# Patient Record
Sex: Male | Born: 1980 | Race: White | Hispanic: No | Marital: Single | Smoking: Current every day smoker
Health system: Southern US, Community
[De-identification: ages and names within clinical notes are randomized; demographics above are authoritative.]

## PROBLEM LIST (undated history)

## (undated) DIAGNOSIS — F419 Anxiety disorder, unspecified: Secondary | ICD-10-CM

---

## 2015-07-22 ENCOUNTER — Emergency Department (HOSPITAL_COMMUNITY): Payer: BLUE CROSS/BLUE SHIELD

## 2015-07-22 ENCOUNTER — Encounter (HOSPITAL_COMMUNITY): Payer: Self-pay

## 2015-07-22 ENCOUNTER — Emergency Department (HOSPITAL_COMMUNITY)
Admission: EM | Admit: 2015-07-22 | Discharge: 2015-07-22 | Disposition: A | Discharge status: Home / Self Care | Payer: BLUE CROSS/BLUE SHIELD | Attending: Emergency Medicine | Admitting: Emergency Medicine

## 2015-07-22 DIAGNOSIS — F1310 Sedative, hypnotic or anxiolytic abuse, uncomplicated: Secondary | ICD-10-CM | POA: Diagnosis not present

## 2015-07-22 DIAGNOSIS — F419 Anxiety disorder, unspecified: Secondary | ICD-10-CM | POA: Insufficient documentation

## 2015-07-22 DIAGNOSIS — Z79899 Other long term (current) drug therapy: Secondary | ICD-10-CM | POA: Insufficient documentation

## 2015-07-22 DIAGNOSIS — F17200 Nicotine dependence, unspecified, uncomplicated: Secondary | ICD-10-CM | POA: Insufficient documentation

## 2015-07-22 DIAGNOSIS — R0602 Shortness of breath: Secondary | ICD-10-CM | POA: Diagnosis not present

## 2015-07-22 DIAGNOSIS — R0789 Other chest pain: Secondary | ICD-10-CM | POA: Insufficient documentation

## 2015-07-22 DIAGNOSIS — R079 Chest pain, unspecified: Secondary | ICD-10-CM | POA: Diagnosis present

## 2015-07-22 DIAGNOSIS — Z7982 Long term (current) use of aspirin: Secondary | ICD-10-CM | POA: Insufficient documentation

## 2015-07-22 HISTORY — DX: Anxiety disorder, unspecified: F41.9

## 2015-07-22 LAB — DIFFERENTIAL
BASOS ABS: 0 10*3/uL (ref 0.0–0.1)
Basophils Relative: 0 %
Eosinophils Absolute: 0 10*3/uL (ref 0.0–0.7)
Eosinophils Relative: 0 %
LYMPHS ABS: 1 10*3/uL (ref 0.7–4.0)
Lymphocytes Relative: 6 %
MONOS PCT: 7 %
Monocytes Absolute: 1.3 10*3/uL — ABNORMAL HIGH (ref 0.1–1.0)
NEUTROS ABS: 15.7 10*3/uL — AB (ref 1.7–7.7)
Neutrophils Relative %: 87 %

## 2015-07-22 LAB — BASIC METABOLIC PANEL
Anion gap: 12 (ref 5–15)
BUN: 13 mg/dL (ref 6–20)
CHLORIDE: 104 mmol/L (ref 101–111)
CO2: 25 mmol/L (ref 22–32)
CREATININE: 1 mg/dL (ref 0.61–1.24)
Calcium: 9.1 mg/dL (ref 8.9–10.3)
Glucose, Bld: 110 mg/dL — ABNORMAL HIGH (ref 65–99)
Potassium: 4.6 mmol/L (ref 3.5–5.1)
SODIUM: 141 mmol/L (ref 135–145)

## 2015-07-22 LAB — URINALYSIS, ROUTINE W REFLEX MICROSCOPIC
Bilirubin Urine: NEGATIVE
GLUCOSE, UA: NEGATIVE mg/dL
HGB URINE DIPSTICK: NEGATIVE
Ketones, ur: 15 mg/dL — AB
Leukocytes, UA: NEGATIVE
Nitrite: NEGATIVE
Protein, ur: NEGATIVE mg/dL
Specific Gravity, Urine: 1.042 — ABNORMAL HIGH (ref 1.005–1.030)
pH: 5 (ref 5.0–8.0)

## 2015-07-22 LAB — RAPID URINE DRUG SCREEN, HOSP PERFORMED
Amphetamines: NOT DETECTED
BARBITURATES: NOT DETECTED
Benzodiazepines: POSITIVE — AB
Cocaine: NOT DETECTED
OPIATES: NOT DETECTED
TETRAHYDROCANNABINOL: NOT DETECTED

## 2015-07-22 LAB — CBC
HCT: 44.8 % (ref 39.0–52.0)
Hemoglobin: 14.9 g/dL (ref 13.0–17.0)
MCH: 31.3 pg (ref 26.0–34.0)
MCHC: 33.3 g/dL (ref 30.0–36.0)
MCV: 94.1 fL (ref 78.0–100.0)
PLATELETS: 233 10*3/uL (ref 150–400)
RBC: 4.76 MIL/uL (ref 4.22–5.81)
RDW: 12.5 % (ref 11.5–15.5)
WBC: 17.8 10*3/uL — AB (ref 4.0–10.5)

## 2015-07-22 LAB — HEPATIC FUNCTION PANEL
ALBUMIN: 3.7 g/dL (ref 3.5–5.0)
ALK PHOS: 44 U/L (ref 38–126)
ALT: 23 U/L (ref 17–63)
AST: 31 U/L (ref 15–41)
BILIRUBIN TOTAL: 0.3 mg/dL (ref 0.3–1.2)
Bilirubin, Direct: <0.1 mg/dL — ABNORMAL LOW (ref 0.1–0.5)
Total Protein: 6.4 g/dL — ABNORMAL LOW (ref 6.5–8.1)

## 2015-07-22 LAB — I-STAT TROPONIN, ED
TROPONIN I, POC: 0 ng/mL (ref 0.00–0.08)
Troponin i, poc: 0.02 ng/mL (ref 0.00–0.08)

## 2015-07-22 LAB — ETHANOL: ALCOHOL ETHYL (B): 79 mg/dL — AB (ref <5)

## 2015-07-22 LAB — LIPASE, BLOOD: LIPASE: 25 U/L (ref 11–51)

## 2015-07-22 MED ORDER — IOHEXOL 350 MG/ML SOLN
90.0000 mL | Freq: Once | INTRAVENOUS | Status: AC | PRN
  Administered 2015-07-22: 90 mL via INTRAVENOUS

## 2015-07-22 MED ORDER — SODIUM CHLORIDE 0.9 % IV SOLN
INTRAVENOUS | Status: DC
  Administered 2015-07-22: 11:00:00 via INTRAVENOUS

## 2015-07-22 MED ORDER — OXYCODONE-ACETAMINOPHEN 5-325 MG PO TABS
1.0000 | ORAL_TABLET | Freq: Once | ORAL | Status: AC
  Administered 2015-07-22: 1 via ORAL
  Filled 2015-07-22: qty 1

## 2015-07-22 MED ORDER — SODIUM CHLORIDE 0.9 % IV SOLN
1000.0000 mL | Freq: Once | INTRAVENOUS | Status: AC
  Administered 2015-07-22: 1000 mL via INTRAVENOUS

## 2015-07-22 MED ORDER — FAMOTIDINE IN NACL 20-0.9 MG/50ML-% IV SOLN
20.0000 mg | Freq: Once | INTRAVENOUS | Status: AC
Start: 2015-07-22 — End: 2015-07-22
  Administered 2015-07-22: 20 mg via INTRAVENOUS
  Filled 2015-07-22: qty 50

## 2015-07-22 MED ORDER — LORAZEPAM 2 MG/ML IJ SOLN
2.0000 mg | Freq: Once | INTRAMUSCULAR | Status: AC
  Administered 2015-07-22: 2 mg via INTRAVENOUS
  Filled 2015-07-22: qty 1

## 2015-07-22 NOTE — Progress Notes (Signed)
Discussed patient with PA Mayme Genta. Presents with atypical chest pain, worst with respiration. CT PE negative. I have recommended repeat EKG and troponin, if negative ok for discharge and we will arrange new patient appt. Of note does have some LAD disease incidentally found on CT, however there is not supportive evidence of ischemia a this time. Defer leukocytosis evaluation to ER.   Dominga Ferry MD

## 2015-07-22 NOTE — ED Notes (Signed)
Pt at xray

## 2015-07-22 NOTE — ED Provider Notes (Signed)
CSN: [id number]     Arrival date & time 07/22/15  4098 History   First MD Initiated Contact with Patient 07/22/15 [phone]     Chief Complaint  Patient presents with  . Chest Pain  . Shortness of Breath     (Consider location/radiation/quality/duration/timing/severity/associated sxs/prior Treatment) HPI Johnny Graham is a 35 y.o. male with history of anxiety comes in for evaluation of chest pain and shortness of breath. Patient works at approximately 12:00 AM this morning, while lying in the bed he experienced sudden onset diffuse chest pain that radiated through to his back. He cannot characterize the discomfort other than to say "it is uncomfortable". He tried taking Xanax as he thought it was a panic attack which seemed to help, but symptoms returned several hours later. He also took a Nexium at that time which did not seem to help. He reports having tacos for dinner. He reports inability to take a deep breath as this exacerbates his discomfort. He reports lying flat worsens his discomfort, while sitting up seems to help his discomfort. Denies any associated nausea, vomiting, diaphoresis, numbness or weakness. No history of hypertension, hyperlipidemia, diabetes or family heart history. He does admit to smoking 5-6 cigarettes per day. He reports that he is a frequent exerciser and does not feel the sensation with intense exercise. No recent travel, surgeries or immobilizations, hemoptysis, unilateral leg swelling, exogenous estrogen use or personal history of cancer.  Past Medical History  Diagnosis Date  . Anxiety    History reviewed. No pertinent past surgical history. History reviewed. No pertinent family history. Social History  Substance Use Topics  . Smoking status: Current Every Day Smoker  . Smokeless tobacco: None  . Alcohol Use: Yes    Review of Systems A 10 point review of systems was completed and was negative except for pertinent positives and negatives as mentioned in the  history of present illness     Allergies  Review of patient's allergies indicates no known allergies.  Home Medications   Prior to Admission medications   Medication Sig Start Date End Date Taking? Authorizing Provider  ALPRAZolam Prudy Feeler) 1 MG tablet Take 1 mg by mouth once.   Yes Historical Provider, MD  aspirin 325 MG tablet Take 325 mg by mouth once.   Yes Historical Provider, MD  busPIRone (BUSPAR) 5 MG tablet Take 5 mg by mouth 4 (four) times daily.   Yes Historical Provider, MD  esomeprazole (NEXIUM) 20 MG capsule Take 20 mg by mouth once.   Yes Historical Provider, MD   BP 123/69 mmHg  Pulse 96  Temp(Src) 98.4 F (36.9 C) (Oral)  Resp 25  SpO2 95% Physical Exam  Constitutional: He is oriented to person, place, and time. He appears well-developed and well-nourished.  HENT:  Head: Normocephalic and atraumatic.  Mouth/Throat: Oropharynx is clear and moist.  Eyes: Conjunctivae are normal. Pupils are equal, round, and reactive to light. Right eye exhibits no discharge. Left eye exhibits no discharge. No scleral icterus.  Neck: Neck supple.  Cardiovascular: Normal rate, regular rhythm and normal heart sounds.   Pulmonary/Chest: Effort normal. No respiratory distress.  Difficult to appreciate lung sounds, patient cannot fully inspire due to pain.  Abdominal: Soft. There is no tenderness.  Musculoskeletal: Normal range of motion. He exhibits no edema or tenderness.  Neurological: He is alert and oriented to person, place, and time.  Cranial Nerves II-XII grossly intact  Skin: Skin is warm and dry. No rash noted.  Psychiatric: He has a  normal mood and affect.  Nursing note and vitals reviewed.   ED Course  Procedures (including critical care time) Labs Review Labs Reviewed  BASIC METABOLIC PANEL - Abnormal; Notable for the following:    Glucose, Bld 110 (*)    All other components within normal limits  CBC - Abnormal; Notable for the following:    WBC 17.8 (*)    All  other components within normal limits  DIFFERENTIAL - Abnormal; Notable for the following:    Neutro Abs 15.7 (*)    Monocytes Absolute 1.3 (*)    All other components within normal limits  HEPATIC FUNCTION PANEL - Abnormal; Notable for the following:    Total Protein 6.4 (*)    Bilirubin, Direct <0.1 (*)    All other components within normal limits  ETHANOL - Abnormal; Notable for the following:    Alcohol, Ethyl (B) 79 (*)    All other components within normal limits  URINALYSIS, ROUTINE W REFLEX MICROSCOPIC (NOT AT [facility]) - Abnormal; Notable for the following:    Specific Gravity, Urine 1.042 (*)    Ketones, ur 15 (*)    All other components within normal limits  URINE RAPID DRUG SCREEN, HOSP PERFORMED - Abnormal; Notable for the following:    Benzodiazepines POSITIVE (*)    All other components within normal limits  LIPASE, BLOOD  CBC WITH DIFFERENTIAL/PLATELET  I-STAT TROPOININ, ED  I-STAT TROPOININ, ED    Imaging Review Dg Chest 2 View  07/22/2015  CLINICAL DATA:  Pt reports chest pain on both sides of his chest radiating to his back with SOB since 1200 AM today; light smoker EXAM: CHEST  2 VIEW COMPARISON:  None. FINDINGS: The heart size and mediastinal contours are within normal limits. Both lungs are clear. No pleural effusion or pneumothorax. The visualized skeletal structures are unremarkable. IMPRESSION: No active cardiopulmonary disease. Electronically Signed   By: Amie Portland M.D.   On: 07/22/2015 08:04   Ct Angio Chest Pe W/cm &/or Wo Cm  07/22/2015  CLINICAL DATA:  Chest pain and shortness of breath. EXAM: CT ANGIOGRAPHY CHEST WITH CONTRAST TECHNIQUE: Multidetector CT imaging of the chest was performed using the standard protocol during bolus administration of intravenous contrast. Multiplanar CT image reconstructions and MIPs were obtained to evaluate the vascular anatomy. CONTRAST:  90mL OMNIPAQUE IOHEXOL 350 MG/ML SOLN COMPARISON:  Chest radiograph from earlier  today. FINDINGS: Mediastinum/Nodes: The study is high quality for the evaluation of pulmonary embolism. There are no filling defects in the central, lobar, segmental or subsegmental pulmonary artery branches to suggest acute pulmonary embolism. Great vessels are normal in course and caliber. Normal heart size. No significant pericardial fluid/thickening. Left anterior descending coronary atherosclerosis. Normal visualized thyroid. Normal esophagus. No pathologically enlarged axillary, mediastinal or hilar lymph nodes. Lungs/Pleura: No pneumothorax. No pleural effusion. No acute consolidative airspace disease, significant pulmonary nodules or lung masses. There is subsegmental atelectasis in the dependent lower lobes. Upper abdomen: Unremarkable. Musculoskeletal:  No aggressive appearing focal osseous lesions. Review of the MIP images confirms the above findings. IMPRESSION: 1. No acute pulmonary embolism. 2. LAD coronary atherosclerosis. 3. Mild bibasilar atelectasis. Electronically Signed   By: Delbert Phenix M.D.   On: 07/22/2015 10:52   I have personally reviewed and evaluated these images and lab results as part of my medical decision-making.   EKG Interpretation   Date/Time:  Sunday July 22 2015 14:15:11 EST Ventricular Rate:  94 PR Interval:  158 QRS Duration: 95 QT Interval:  345  QTC Calculation: 431 R Axis:   43 Text Interpretation:  Sinus rhythm ST elevation suggests acute  pericarditis Baseline wander in lead(s) V3 agree. Confirmed by Donnald Garre,  MD, Lebron Conners [id number]) on 07/22/2015 2:29:58 PM     Meds given in ED:  Medications  0.9 %  sodium chloride infusion ( Intravenous Stopped 07/22/15 1451)  oxyCODONE-acetaminophen (PERCOCET/ROXICET) 5-325 MG per tablet 1 tablet (1 tablet Oral Given 07/22/15 0815)  famotidine (PEPCID) IVPB 20 mg premix (0 mg Intravenous Stopped 07/22/15 1017)  LORazepam (ATIVAN) injection 2 mg (2 mg Intravenous Given 07/22/15 0947)  0.9 %  sodium chloride infusion (0  mLs Intravenous Stopped 07/22/15 1220)  iohexol (OMNIPAQUE) 350 MG/ML injection 90 mL (90 mLs Intravenous Contrast Given 07/22/15 1022)    New Prescriptions   No medications on file   Filed Vitals:   07/22/15 1230 07/22/15 1315 07/22/15 1400 07/22/15 1445  BP: 115/69 107/67 122/86 123/69  Pulse: 89 91 101 96  Temp:      TempSrc:      Resp: 33 SpO2: 95% 98% 97% 95%    MDM  Johnny Graham is a 34 y.o. male who presents for evaluation of chest pain and shortness of breath that woke him from sleep at approximately midnight this morning. Risk factors include smoking 5 or 6 cigarettes per day, otherwise no risk factors. Difficult to appreciate lung sounds on auscultation, patient does not take full breaths due to discomfort. Physical exam is otherwise unremarkable. Plan to obtain EKG, troponin, chest x-ray and screening labs.  Symptoms improved with Percocet. Patient has leukocytosis 17.8 with left shift, chest x-ray negative. Will obtain CT to evaluate for occult pneumonia. CT shows LAD vessel disease. No pulmonary embolus or other acute cardio pulmonary pathology. Initial troponin negative. EKG was nonischemic changes. Consult cardiology, Dr. Wyline Mood recommends repeat EKG, delta troponin and if no changes will be able to follow-up outpatient. EKG somewhat changed, but still no evidence of ischemia. Delta troponin negative. Patient will be contacted to follow-up outpatient with cardiology. Prior to patient discharge, I discussed and reviewed this case with my attending, Dr. Donnald Garre, who also saw and evaluated the patient and agrees with above plan.  Final diagnoses:  Atypical chest pain        Joycie Peek, PA-C 07/22/15 1513  Arby Barrette, MD 07/23/15 [phone]

## 2015-07-22 NOTE — Discharge Instructions (Signed)
You were evaluated in the ED today for your chest pain. There does not appear to be an emergent cause her symptoms at this time. However, cardiology will contact you to schedule an outpatient appointment. He may continue taking NSAID such as ibuprofen for your discomfort in the meantime. Return to ED for any new or worsening symptoms.  Nonspecific Chest Pain  Chest pain can be caused by many different conditions. There is always a chance that your pain could be related to something serious, such as a heart attack or a blood clot in your lungs. Chest pain can also be caused by conditions that are not life-threatening. If you have chest pain, it is very important to follow up with your health care provider. CAUSES  Chest pain can be caused by:  Heartburn.  Pneumonia or bronchitis.  Anxiety or stress.  Inflammation around your heart (pericarditis) or lung (pleuritis or pleurisy).  A blood clot in your lung.  A collapsed lung (pneumothorax). It can develop suddenly on its own (spontaneous pneumothorax) or from trauma to the chest.  Shingles infection (varicella-zoster virus).  Heart attack.  Damage to the bones, muscles, and cartilage that make up your chest wall. This can include:  Bruised bones due to injury.  Strained muscles or cartilage due to frequent or repeated coughing or overwork.  Fracture to one or more ribs.  Sore cartilage due to inflammation (costochondritis). RISK FACTORS  Risk factors for chest pain may include:  Activities that increase your risk for trauma or injury to your chest.  Respiratory infections or conditions that cause frequent coughing.  Medical conditions or overeating that can cause heartburn.  Heart disease or family history of heart disease.  Conditions or health behaviors that increase your risk of developing a blood clot.  Having had chicken pox (varicella zoster). SIGNS AND SYMPTOMS Chest pain can feel like:  Burning or tingling on the  surface of your chest or deep in your chest.  Crushing, pressure, aching, or squeezing pain.  Dull or sharp pain that is worse when you move, cough, or take a deep breath.  Pain that is also felt in your back, neck, shoulder, or arm, or pain that spreads to any of these areas. Your chest pain may come and go, or it may stay constant. DIAGNOSIS Lab tests or other studies may be needed to find the cause of your pain. Your health care provider may have you take a test called an ambulatory ECG (electrocardiogram). An ECG records your heartbeat patterns at the time the test is performed. You may also have other tests, such as:  Transthoracic echocardiogram (TTE). During echocardiography, sound waves are used to create a picture of all of the heart structures and to look at how blood flows through your heart.  Transesophageal echocardiogram (TEE).This is a more advanced imaging test that obtains images from inside your body. It allows your health care provider to see your heart in finer detail.  Cardiac monitoring. This allows your health care provider to monitor your heart rate and rhythm in real time.  Holter monitor. This is a portable device that records your heartbeat and can help to diagnose abnormal heartbeats. It allows your health care provider to track your heart activity for several days, if needed.  Stress tests. These can be done through exercise or by taking medicine that makes your heart beat more quickly.  Blood tests.  Imaging tests. TREATMENT  Your treatment depends on what is causing your chest pain. Treatment may  include:  Medicines. These may include:  Acid blockers for heartburn.  Anti-inflammatory medicine.  Pain medicine for inflammatory conditions.  Antibiotic medicine, if an infection is present.  Medicines to dissolve blood clots.  Medicines to treat coronary artery disease.  Supportive care for conditions that do not require medicines. This may  include:  Resting.  Applying heat or cold packs to injured areas.  Limiting activities until pain decreases. HOME CARE INSTRUCTIONS  If you were prescribed an antibiotic medicine, finish it all even if you start to feel better.  Avoid any activities that bring on chest pain.  Do not use any tobacco products, including cigarettes, chewing tobacco, or electronic cigarettes. If you need help quitting, ask your health care provider.  Do not drink alcohol.  Take medicines only as directed by your health care provider.  Keep all follow-up visits as directed by your health care provider. This is important. This includes any further testing if your chest pain does not go away.  If heartburn is the cause for your chest pain, you may be told to keep your head raised (elevated) while sleeping. This reduces the chance that acid will go from your stomach into your esophagus.  Make lifestyle changes as directed by your health care provider. These may include:  Getting regular exercise. Ask your health care provider to suggest some activities that are safe for you.  Eating a heart-healthy diet. A registered dietitian can help you to learn healthy eating options.  Maintaining a healthy weight.  Managing diabetes, if necessary.  Reducing stress. SEEK MEDICAL CARE IF:  Your chest pain does not go away after treatment.  You have a rash with blisters on your chest.  You have a fever. SEEK IMMEDIATE MEDICAL CARE IF:   Your chest pain is worse.  You have an increasing cough, or you cough up blood.  You have severe abdominal pain.  You have severe weakness.  You faint.  You have chills.  You have sudden, unexplained chest discomfort.  You have sudden, unexplained discomfort in your arms, back, neck, or jaw.  You have shortness of breath at any time.  You suddenly start to sweat, or your skin gets clammy.  You feel nauseous or you vomit.  You suddenly feel light-headed or  dizzy.  Your heart begins to beat quickly, or it feels like it is skipping beats. These symptoms may represent a serious problem that is an emergency. Do not wait to see if the symptoms will go away. Get medical help right away. Call your local emergency services (911 in the [place] Do not drive yourself to the hospital.   This information is not intended to replace advice given to you by your health care provider. Make sure you discuss any questions you have with your health care provider.   Document Released: 02/26/2005 Document Revised: 06/09/2014 Document Reviewed: 12/23/2013 Elsevier Interactive Patient Education [employer].

## 2015-07-22 NOTE — ED Notes (Signed)
Pt here for chest pain and sob that woke him from sleep. Denies n/v, diaphoresis  Or weakness.

## 2017-07-20 IMAGING — CT CT ANGIO CHEST
2 of 9 series · 14 of 36 positions shown · IV contrast (Iohexol (Omnipaque 350))
Comparison: Chest radiograph from earlier today.

CLINICAL DATA: Chest pain and shortness of breath.

EXAM:
CT ANGIOGRAPHY CHEST WITH CONTRAST
TECHNIQUE: Multidetector CT imaging of the chest was performed using the
standard protocol during bolus administration of intravenous
contrast. Multiplanar CT image reconstructions and MIPs were
obtained to evaluate the vascular anatomy.
CONTRAST:  90mL OMNIPAQUE IOHEXOL 350 MG/ML SOLN

[Series 402: thins pacs · axial · 0.63mm/px · z∈[-282,-58]mm · 13 of 262 slices shown]
[im 19/262  lung]
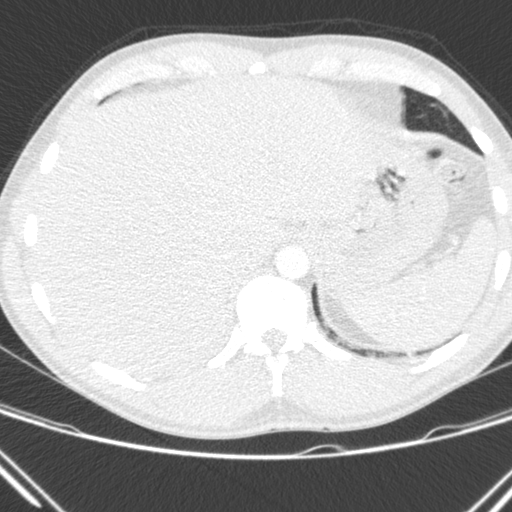
[im 38/262  mediastinal]
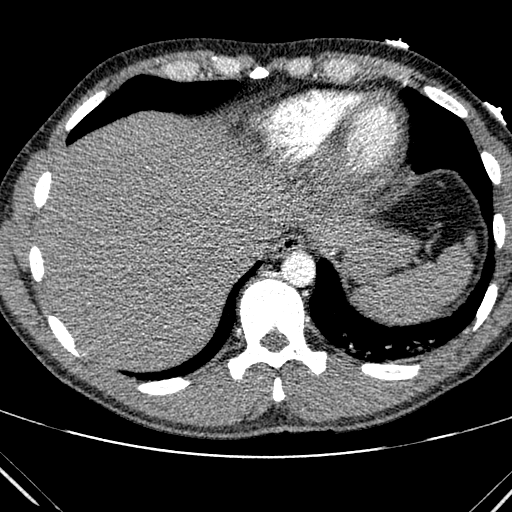
[im 56/262  lung]
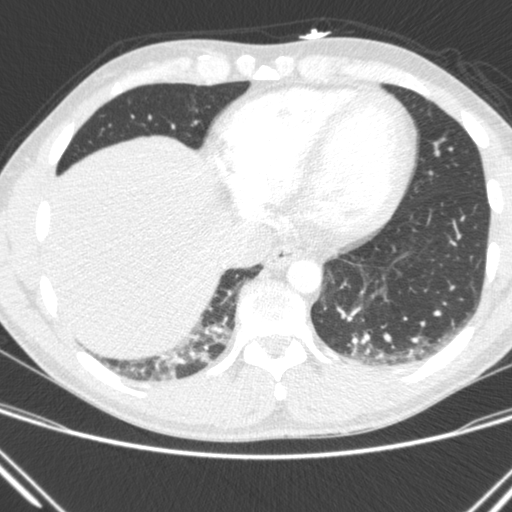
[im 75/262  mediastinal]
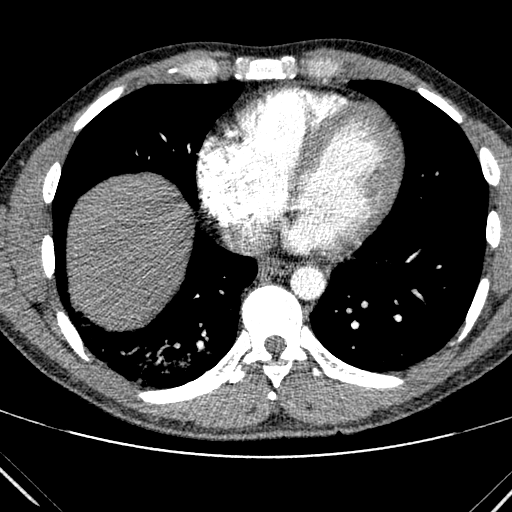
[im 94/262  lung]
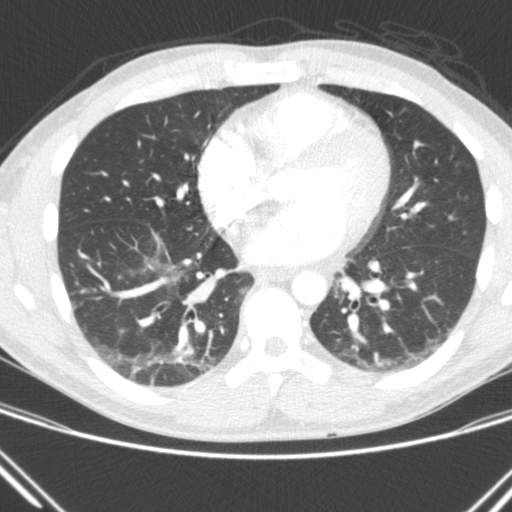
[im 112/262  mediastinal]
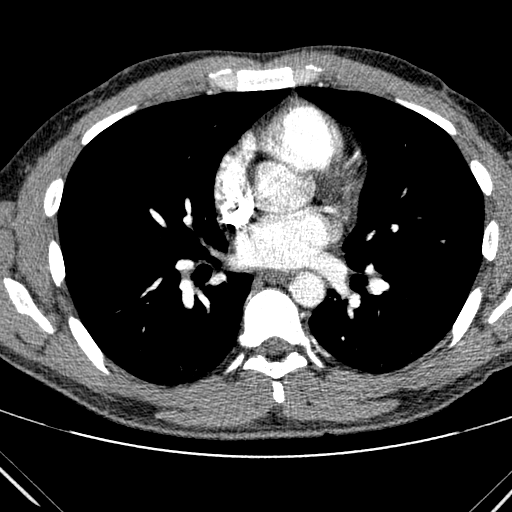
[im 131/262  lung]
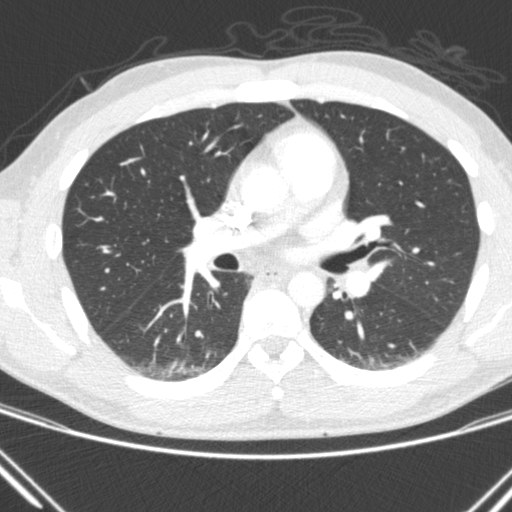
[im 150/262  mediastinal]
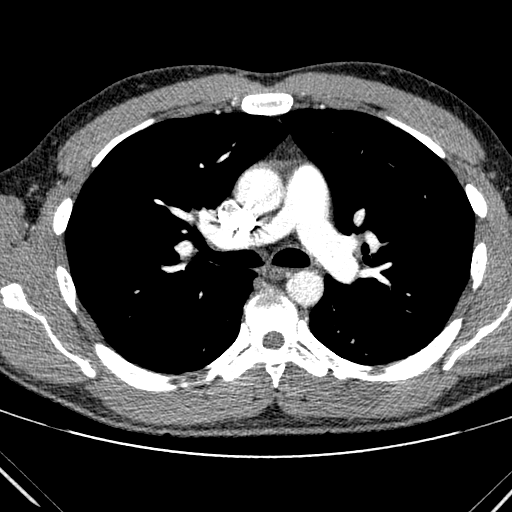
[im 168/262  lung]
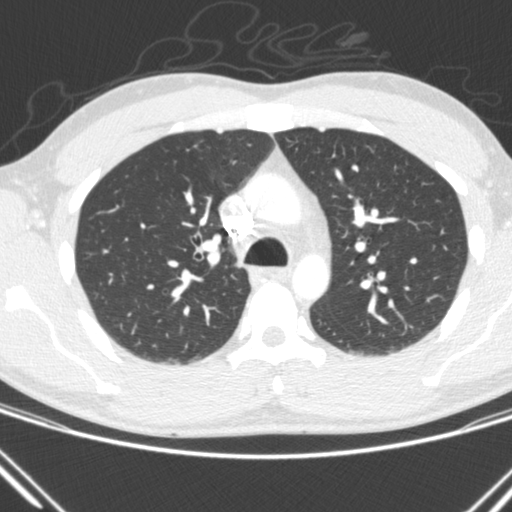
[im 187/262  mediastinal]
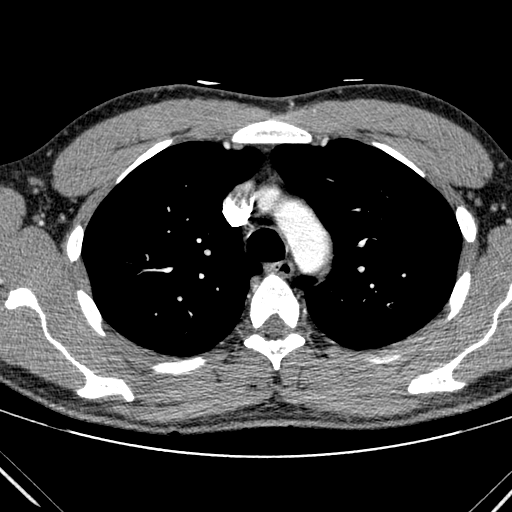
[im 206/262  lung]
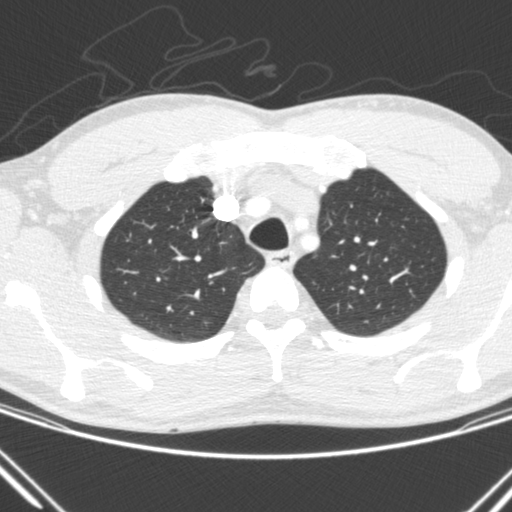
[im 224/262  mediastinal]
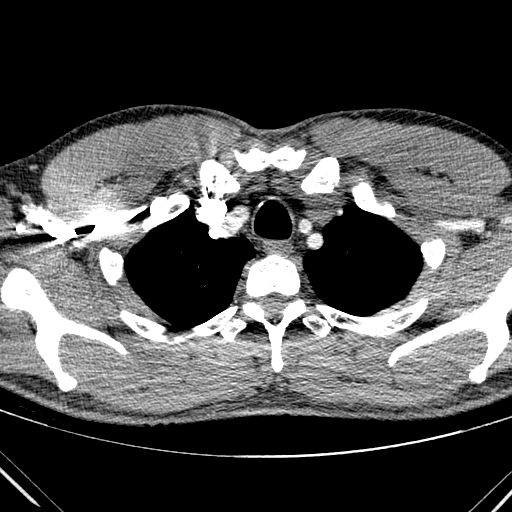
[im 243/262  lung]
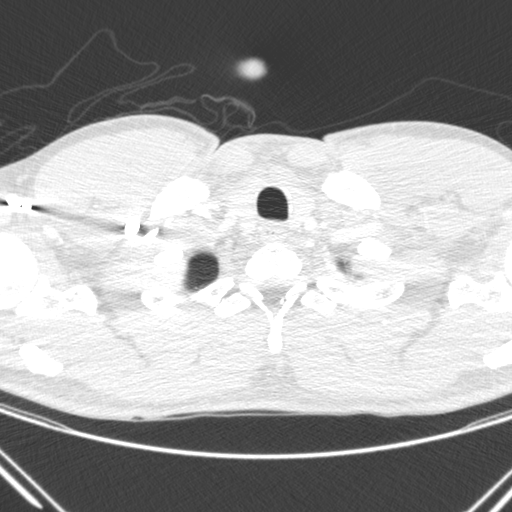

[Series 404: coronal · coronal · 0.63mm/px · 1 of 114 slices shown]
[im 57/114  mediastinal]
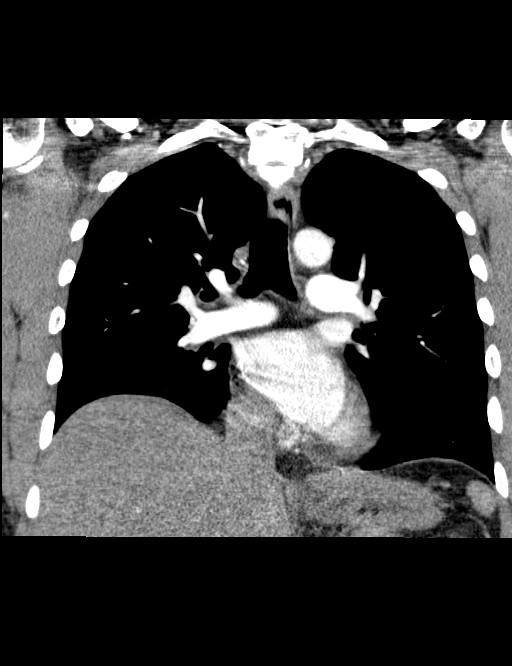

[14 of 36 positions shown; findings below may reference images not displayed]

FINDINGS: Mediastinum/Nodes: The study is high quality for the evaluation of
pulmonary embolism. There are no filling defects in the central,
lobar, segmental or subsegmental pulmonary artery branches to
suggest acute pulmonary embolism. Great vessels are normal in course
and caliber. Normal heart size. No significant pericardial
fluid/thickening. Left anterior descending coronary atherosclerosis.
Normal visualized thyroid. Normal esophagus. No pathologically
enlarged axillary, mediastinal or hilar lymph nodes.

Lungs/Pleura: No pneumothorax. No pleural effusion. No acute
consolidative airspace disease, significant pulmonary nodules or
lung masses. There is subsegmental atelectasis in the dependent
lower lobes.

Upper abdomen: Unremarkable.

Musculoskeletal:  No aggressive appearing focal osseous lesions.

Review of the MIP images confirms the above findings.
IMPRESSION: 1. No acute pulmonary embolism.
2. LAD coronary atherosclerosis.
3. Mild bibasilar atelectasis.
